# Patient Record
Sex: Female | Born: 1970 | Hispanic: Yes | Marital: Single | Smoking: Never smoker
Health system: Southern US, Community
[De-identification: ages and names within clinical notes are randomized; demographics above are authoritative.]

## PROBLEM LIST (undated history)

## (undated) DIAGNOSIS — F329 Major depressive disorder, single episode, unspecified: Secondary | ICD-10-CM

## (undated) DIAGNOSIS — K219 Gastro-esophageal reflux disease without esophagitis: Secondary | ICD-10-CM

## (undated) DIAGNOSIS — F32A Depression, unspecified: Secondary | ICD-10-CM

---
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
## 2010-04-02 ENCOUNTER — Ambulatory Visit: Payer: Self-pay

## 2011-01-15 ENCOUNTER — Ambulatory Visit: Payer: Self-pay | Admitting: Internal Medicine

## 2013-06-05 ENCOUNTER — Emergency Department: Payer: Self-pay | Admitting: Emergency Medicine

## 2013-06-05 LAB — COMPREHENSIVE METABOLIC PANEL
ALT: 16 U/L (ref 12–78)
AST: 17 U/L (ref 15–37)
Albumin: 3.7 g/dL (ref 3.4–5.0)
Alkaline Phosphatase: 88 U/L
Anion Gap: 4 — ABNORMAL LOW (ref 7–16)
BUN: 9 mg/dL (ref 7–18)
Bilirubin,Total: 0.4 mg/dL (ref 0.2–1.0)
CALCIUM: 9.1 mg/dL (ref 8.5–10.1)
CO2: 28 mmol/L (ref 21–32)
Chloride: 105 mmol/L (ref 98–107)
Creatinine: 0.67 mg/dL (ref 0.60–1.30)
EGFR (African American): >60
EGFR (Non-African Amer.): >60
GLUCOSE: 99 mg/dL (ref 65–99)
Osmolality: 273 (ref 275–301)
Potassium: 3.5 mmol/L (ref 3.5–5.1)
SODIUM: 137 mmol/L (ref 136–145)
Total Protein: 7.5 g/dL (ref 6.4–8.2)

## 2013-06-05 LAB — URINALYSIS, COMPLETE
Bacteria: NONE SEEN
Bilirubin,UR: NEGATIVE
Blood: NEGATIVE
Glucose,UR: NEGATIVE mg/dL (ref 0–75)
Leukocyte Esterase: NEGATIVE
Nitrite: NEGATIVE
Ph: 6 (ref 4.5–8.0)
Protein: 25
RBC,UR: <1 /HPF (ref 0–5)
Specific Gravity: 1.01 (ref 1.003–1.030)

## 2013-06-05 LAB — CBC
HCT: 39.1 % (ref 35.0–47.0)
HGB: 13.1 g/dL (ref 12.0–16.0)
MCH: 29.2 pg (ref 26.0–34.0)
MCHC: 33.5 g/dL (ref 32.0–36.0)
MCV: 87 fL (ref 80–100)
Platelet: 216 10*3/uL (ref 150–440)
RBC: 4.49 10*6/uL (ref 3.80–5.20)
RDW: 13.5 % (ref 11.5–14.5)
WBC: 8.1 10*3/uL (ref 3.6–11.0)

## 2013-06-05 LAB — MAGNESIUM: Magnesium: 2 mg/dL

## 2013-06-05 LAB — PHOSPHORUS: Phosphorus: 3.1 mg/dL (ref 2.5–4.9)

## 2016-03-18 ENCOUNTER — Other Ambulatory Visit: Payer: Self-pay | Admitting: Family Medicine

## 2016-03-18 DIAGNOSIS — Z1231 Encounter for screening mammogram for malignant neoplasm of breast: Secondary | ICD-10-CM

## 2016-04-21 ENCOUNTER — Ambulatory Visit
Admission: RE | Admit: 2016-04-21 | Discharge: 2016-04-21 | Disposition: A | Discharge status: Home / Self Care | Payer: 59 | Source: Ambulatory Visit | Attending: Family Medicine | Admitting: Family Medicine

## 2016-04-21 ENCOUNTER — Encounter (HOSPITAL_COMMUNITY): Payer: Self-pay

## 2016-04-21 DIAGNOSIS — Z1231 Encounter for screening mammogram for malignant neoplasm of breast: Secondary | ICD-10-CM | POA: Diagnosis not present

## 2016-04-21 DIAGNOSIS — R928 Other abnormal and inconclusive findings on diagnostic imaging of breast: Secondary | ICD-10-CM | POA: Diagnosis not present

## 2016-04-28 ENCOUNTER — Other Ambulatory Visit: Payer: Self-pay | Admitting: Family Medicine

## 2016-04-28 DIAGNOSIS — N6489 Other specified disorders of breast: Secondary | ICD-10-CM

## 2016-05-13 ENCOUNTER — Ambulatory Visit
Admission: RE | Admit: 2016-05-13 | Discharge: 2016-05-13 | Disposition: A | Discharge status: Home / Self Care | Payer: PRIVATE HEALTH INSURANCE | Source: Ambulatory Visit | Attending: Family Medicine | Admitting: Family Medicine

## 2016-05-13 DIAGNOSIS — R921 Mammographic calcification found on diagnostic imaging of breast: Secondary | ICD-10-CM | POA: Diagnosis not present

## 2016-05-13 DIAGNOSIS — N6489 Other specified disorders of breast: Secondary | ICD-10-CM

## 2016-11-20 ENCOUNTER — Other Ambulatory Visit: Payer: Self-pay | Admitting: Family Medicine

## 2016-11-20 DIAGNOSIS — R928 Other abnormal and inconclusive findings on diagnostic imaging of breast: Secondary | ICD-10-CM

## 2016-12-25 ENCOUNTER — Ambulatory Visit
Admission: RE | Admit: 2016-12-25 | Discharge: 2016-12-25 | Disposition: A | Discharge status: Home / Self Care | Payer: 59 | Source: Ambulatory Visit | Attending: Family Medicine | Admitting: Family Medicine

## 2016-12-25 DIAGNOSIS — R928 Other abnormal and inconclusive findings on diagnostic imaging of breast: Secondary | ICD-10-CM

## 2016-12-25 DIAGNOSIS — R921 Mammographic calcification found on diagnostic imaging of breast: Secondary | ICD-10-CM | POA: Diagnosis not present

## 2018-08-20 ENCOUNTER — Other Ambulatory Visit: Payer: Self-pay | Admitting: Family Medicine

## 2018-08-20 DIAGNOSIS — Z1231 Encounter for screening mammogram for malignant neoplasm of breast: Secondary | ICD-10-CM

## 2018-09-25 ENCOUNTER — Other Ambulatory Visit: Payer: Self-pay

## 2018-09-25 ENCOUNTER — Emergency Department
Admission: EM | Admit: 2018-09-25 | Discharge: 2018-09-25 | Disposition: A | Discharge status: Home / Self Care | Payer: 59 | Attending: Emergency Medicine | Admitting: Emergency Medicine

## 2018-09-25 ENCOUNTER — Emergency Department: Payer: 59

## 2018-09-25 DIAGNOSIS — R55 Syncope and collapse: Secondary | ICD-10-CM

## 2018-09-25 DIAGNOSIS — I951 Orthostatic hypotension: Secondary | ICD-10-CM | POA: Insufficient documentation

## 2018-09-25 DIAGNOSIS — R42 Dizziness and giddiness: Secondary | ICD-10-CM | POA: Diagnosis present

## 2018-09-25 LAB — URINALYSIS, COMPLETE (UACMP) WITH MICROSCOPIC
Bilirubin Urine: NEGATIVE
Glucose, UA: NEGATIVE mg/dL
Hgb urine dipstick: NEGATIVE
Ketones, ur: NEGATIVE mg/dL
Nitrite: NEGATIVE
Protein, ur: NEGATIVE mg/dL
Specific Gravity, Urine: 1.013 (ref 1.005–1.030)
pH: 6 (ref 5.0–8.0)

## 2018-09-25 LAB — BASIC METABOLIC PANEL
Anion gap: 10 (ref 5–15)
BUN: 15 mg/dL (ref 6–20)
CO2: 30 mmol/L (ref 22–32)
Calcium: 8.8 mg/dL — ABNORMAL LOW (ref 8.9–10.3)
Chloride: 99 mmol/L (ref 98–111)
Creatinine, Ser: 0.77 mg/dL (ref 0.44–1.00)
GFR calc Af Amer: >60 mL/min (ref >60)
GFR calc non Af Amer: >60 mL/min (ref >60)
Glucose, Bld: 147 mg/dL — ABNORMAL HIGH (ref 70–99)
Potassium: 4 mmol/L (ref 3.5–5.1)
Sodium: 139 mmol/L (ref 135–145)

## 2018-09-25 LAB — CBC
HCT: 38.9 % (ref 36.0–46.0)
Hemoglobin: 13.1 g/dL (ref 12.0–15.0)
MCH: 29.8 pg (ref 26.0–34.0)
MCHC: 33.7 g/dL (ref 30.0–36.0)
MCV: 88.4 fL (ref 80.0–100.0)
Platelets: 227 10*3/uL (ref 150–400)
RBC: 4.4 MIL/uL (ref 3.87–5.11)
RDW: 12.4 % (ref 11.5–15.5)
WBC: 11 10*3/uL — ABNORMAL HIGH (ref 4.0–10.5)
nRBC: 0 % (ref 0.0–0.2)

## 2018-09-25 LAB — TROPONIN I: Troponin I: <0.03 ng/mL (ref <0.03)

## 2018-09-25 LAB — POCT PREGNANCY, URINE: Preg Test, Ur: NEGATIVE

## 2018-09-25 MED ORDER — SODIUM CHLORIDE 0.9 % IV BOLUS
1000.0000 mL | Freq: Once | INTRAVENOUS | Status: AC
  Administered 2018-09-25: 1000 mL via INTRAVENOUS

## 2018-09-25 MED ORDER — ONDANSETRON HCL 4 MG/2ML IJ SOLN
4.0000 mg | Freq: Once | INTRAMUSCULAR | Status: AC
  Administered 2018-09-25: 4 mg via INTRAVENOUS
  Filled 2018-09-25: qty 2

## 2018-09-25 MED ORDER — SODIUM CHLORIDE 0.9% FLUSH
3.0000 mL | Freq: Once | INTRAVENOUS | Status: AC
  Administered 2018-09-25: 3 mL via INTRAVENOUS

## 2018-09-25 MED ORDER — ACETAMINOPHEN 500 MG PO TABS
1000.0000 mg | ORAL_TABLET | ORAL | Status: AC
  Administered 2018-09-25: 1000 mg via ORAL
  Filled 2018-09-25: qty 2

## 2018-09-25 NOTE — ED Triage Notes (Signed)
Right side ear pain with frontal headache x 1 month . currently being treated with ABX for sinus infection." Reports was dizzy today went outside for air and felt like she passed out. Reports sitting when she passed out". C/o of headache at present time 6/10

## 2018-09-25 NOTE — ED Provider Notes (Signed)
[facility] Emergency Department Provider Note   ____________________________________________   First MD Initiated Contact with Patient 09/25/18 1320     (approximate)  I have reviewed the triage vital signs and the nursing notes.   HISTORY  Chief Complaint Dizziness and Near Syncope    HPI Kara Edwards is a 48 y.o. female who reports no major medical history.  Patient does report that roughly a week or so ago she started to develop right-sided sinus pain and congestion.  Some runny nose as well.  She was placed on prescription for azithromycin and steroids which she completed through her primary care doctor.  Since that time she has been feeling a little bit congested, and trying to stay hydrated but feels that she has not quite kept up well  She denies chest pain or trouble breathing.  Today she was standing in the kitchen when she started to feel lightheaded, she describes a feeling where she felt hot she went outside to get some fresh air and then became even more fatigued and while sitting in a chair she believes that she briefly may have passed out.  She did not fall out of the chair, but reports she felt like a blanket came over her eyes and then she awoke with her parents awakening her on the chair.  He thinks she may have struck her head on the desk portion of the chair she is bit sore over the right side of the scalp after.  She did not have any palpitations or chest pain.  She is never passed out before.  She felt lightheaded, but at the present time she feels much better.  Some nausea proceeded but no vomiting.  No abdominal pain.  Denies pregnancy and is been into menopause for some time now she reports.  No ongoing fevers or chills.  Feels stuffy in the right ear and her doctor told her that that looked congested at her last visit as well   History reviewed. No pertinent past medical history.  There are no active problems to display for this  patient.   History reviewed. No pertinent surgical history.  Prior to Admission medications   Not on File    Allergies Penicillins  Family History  Problem Relation Age of Onset   Breast cancer Neg Hx     Social History Social History   Tobacco Use   Smoking status: Never Smoker   Smokeless tobacco: Never Used  Substance Use Topics   Alcohol use: Not on file   Drug use: Not on file    Review of Systems Constitutional: No fever/chills recent sinus infections improving Eyes: No visual changes except felt like a blanket over her eyes that is much better now. ENT: No sore throat.  Sinus infection improving Cardiovascular: Denies chest pain.  Believe she passed out briefly. Respiratory: Denies shortness of breath. Gastrointestinal: No abdominal pain.  Felt nauseated during the episode but this is improved. Genitourinary: Negative for dysuria. Musculoskeletal: Negative for back pain. Skin: Negative for rash. Neurological: Negative for headaches, areas of focal weakness or numbness.    ____________________________________________   PHYSICAL EXAM:  VITAL SIGNS: ED Triage Vitals  Enc Vitals Group     BP 09/25/18 1230 119/76     Pulse Rate 09/25/18 1226 (!) 56     Resp 09/25/18 1226 17     Temp 09/25/18 1226 98.6 F (37 C)     Temp Source 09/25/18 1226 Oral     SpO2  09/25/18 1226 98 %     Weight 09/25/18 1231 155 lb (70.3 kg)     Height 09/25/18 1231 5\' 2"  (1.575 m)     Head Circumference --      Peak Flow --      Pain Score 09/25/18 1231 6     Pain Loc --      Pain Edu? --      Excl. in [place]? --     Constitutional: Alert and oriented. Well appearing and in no acute distress.  She is very pleasant. Eyes: Conjunctivae are normal. Head: Atraumatic. Nose: No congestion/rhinnorhea. Mouth/Throat: Mucous membranes are moderately dry. Neck: No stridor.  Cardiovascular: Normal rate, regular rhythm. Grossly normal heart sounds.  Good peripheral  circulation. Respiratory: Normal respiratory effort.  No retractions. Lungs CTAB. Gastrointestinal: Soft and nontender. No distention. Musculoskeletal: No lower extremity tenderness nor edema. Neurologic:  Normal speech and language. No gross focal neurologic deficits are appreciated.  Moves all extremities with normal strength.  Equal smile.  Normal and clear speech. Skin:  Skin is warm, dry and intact. No rash noted. Psychiatric: Mood and affect are normal. Speech and behavior are normal.  ____________________________________________   LABS (all labs ordered are listed, but only abnormal results are displayed)  Labs Reviewed  BASIC METABOLIC PANEL - Abnormal; Notable for the following components:      Result Value   Glucose, Bld 147 (*)    Calcium 8.8 (*)    All other components within normal limits  CBC - Abnormal; Notable for the following components:   WBC 11.0 (*)    All other components within normal limits  URINALYSIS, COMPLETE (UACMP) WITH MICROSCOPIC - Abnormal; Notable for the following components:   Color, Urine YELLOW (*)    APPearance HAZY (*)    Leukocytes,Ua TRACE (*)    Bacteria, UA FEW (*)    All other components within normal limits  TROPONIN I  POCT PREGNANCY, URINE   EKG reviewed entered by me at 1242 Heart rate 50 QRS 110 QTc 460, slight artifact, normal sinus rhythm with first-degree AV block.  No evidence acute ischemia  RADIOLOGY  Ct Head Wo Contrast  Result Date: 09/25/2018 CLINICAL DATA:  RIGHT-sided ear pain with frontal headache for 1 month. EXAM: CT HEAD WITHOUT CONTRAST TECHNIQUE: Contiguous axial images were obtained from the base of the skull through the vertex without intravenous contrast. COMPARISON:  None. FINDINGS: Brain: No evidence of acute infarction, hemorrhage, hydrocephalus, extra-axial collection or mass lesion/mass effect. Normal cerebral volume. No white matter disease. Vascular: No hyperdense vessel or unexpected calcification.  Skull: Normal. Negative for fracture or focal lesion. Sinuses/Orbits: No acute finding. Visualized paranasal sinuses are clear. Other: No middle ear or mastoid fluid is evident. Scalp soft tissues unremarkable. IMPRESSION: Negative exam. Electronically Signed   By: Staci Righter M.D.   On: 09/25/2018 14:39    CT head obtained to further evaluate for possible sinusitis, also evaluate for evidence of trauma as she reports she is mildly sore over the right side of the face she may have struck her head on the chair fall out of bed ____________________________________________   PROCEDURES  Procedure(s) performed: None  Procedures  Critical Care performed: No  ____________________________________________   INITIAL IMPRESSION / ASSESSMENT AND PLAN / ED COURSE  Pertinent labs & imaging results that were available during my care of the patient were reviewed by me and considered in my medical decision making (see chart for details).   Patient presents for syncopal  episode.  In the setting of standing, starting to feel nauseated lightheaded I suspect this was related to some type of orthostatic syncope, possibly driven by some dehydration in the setting of a recent sinusitis which she reports is improving.  Very clinically reassuring examination without neurologic deficit.  No acute cardiopulmonary symptoms.  Reassuring abdominal exam at the present time she is quite asymptomatic.  Plan to check orthostatics, provide IV hydration, check basic labs EKG and exclude concerning causes for syncope though in this case I do not see an immediate concern based on clinical history and exam for a life-threatening etiology, rather suspect likely some dehydration  Clinical Course as of Sep 24 2125  Sat Sep 25, 2018  1544 Patient is ambulatory, feeling improved.  Resting comfortably no distress.  Work-up thus far unrevealing, I suspect likely some type of vasovagal syncopal episode occurred.  Reassuring CT and lab  work, vital signs stable and she has been able to ambulate without distress and feels improved after hydration.  Patient has about 500 mL's of normal saline left to infuse, signed out to Dr. Corky Downs to follow-up on patient after completion of IV fluids and likely discharge to home if she remains well.  The patient is comfortable with this plan and feels well, comfortable with careful return precautions and follow-up.  He is fully alert and appears appropriate for discharge after completing fluids   [MQ]    Clinical Course User Index [MQ] Delman Kitten, MD   Patient feels much better her fluids.  Ongoing care assigned to Dr. Corky Downs, reassess after completion of fluid bolus.  Spaete likely discharged home which the patient is comfortable with.  ____________________________________________   FINAL CLINICAL IMPRESSION(S) / ED DIAGNOSES  Final diagnoses:  Syncope and collapse  Syncope due to orthostatic hypotension        Note:  This document was prepared using Dragon voice recognition software and may include unintentional dictation errors       Delman Kitten, MD 10/02/18 0006

## 2018-09-25 NOTE — ED Notes (Signed)
Pt verbalized understanding of discharge instructions. NAD at this time. 

## 2018-09-25 NOTE — ED Notes (Signed)
Orthostatics completed, ns bolus infusing. Po meds given. Patient reports positive relief of nausea with meds given.

## 2018-12-13 ENCOUNTER — Ambulatory Visit
Admission: RE | Admit: 2018-12-13 | Discharge: 2018-12-13 | Disposition: A | Discharge status: Home / Self Care | Payer: 59 | Source: Ambulatory Visit | Attending: Family Medicine | Admitting: Family Medicine

## 2018-12-13 ENCOUNTER — Other Ambulatory Visit: Payer: Self-pay

## 2018-12-13 DIAGNOSIS — Z1231 Encounter for screening mammogram for malignant neoplasm of breast: Secondary | ICD-10-CM

## 2019-01-04 ENCOUNTER — Other Ambulatory Visit: Payer: Self-pay

## 2019-01-04 ENCOUNTER — Other Ambulatory Visit
Admission: RE | Admit: 2019-01-04 | Discharge: 2019-01-04 | Disposition: A | Discharge status: Home / Self Care | Payer: 59 | Source: Ambulatory Visit | Attending: Gastroenterology | Admitting: Gastroenterology

## 2019-01-04 DIAGNOSIS — Z20828 Contact with and (suspected) exposure to other viral communicable diseases: Secondary | ICD-10-CM | POA: Diagnosis not present

## 2019-01-04 DIAGNOSIS — Z01812 Encounter for preprocedural laboratory examination: Secondary | ICD-10-CM | POA: Diagnosis not present

## 2019-01-04 LAB — SARS CORONAVIRUS 2 (TAT 6-24 HRS): SARS Coronavirus 2: NEGATIVE

## 2019-01-06 ENCOUNTER — Encounter: Payer: Self-pay | Admitting: *Deleted

## 2019-01-07 ENCOUNTER — Ambulatory Visit: Payer: 59 | Admitting: Anesthesiology

## 2019-01-07 ENCOUNTER — Other Ambulatory Visit: Payer: Self-pay

## 2019-01-07 ENCOUNTER — Encounter
Admission: RE | Disposition: A | Discharge status: Home / Self Care | Payer: Self-pay | Source: Ambulatory Visit | Attending: Gastroenterology

## 2019-01-07 ENCOUNTER — Ambulatory Visit
Admission: RE | Admit: 2019-01-07 | Discharge: 2019-01-07 | Disposition: A | Discharge status: Home / Self Care | Payer: 59 | Source: Ambulatory Visit | Attending: Gastroenterology | Admitting: Gastroenterology

## 2019-01-07 DIAGNOSIS — Z8371 Family history of colonic polyps: Secondary | ICD-10-CM | POA: Diagnosis not present

## 2019-01-07 DIAGNOSIS — F329 Major depressive disorder, single episode, unspecified: Secondary | ICD-10-CM | POA: Diagnosis not present

## 2019-01-07 DIAGNOSIS — K219 Gastro-esophageal reflux disease without esophagitis: Secondary | ICD-10-CM | POA: Insufficient documentation

## 2019-01-07 DIAGNOSIS — K228 Other specified diseases of esophagus: Secondary | ICD-10-CM | POA: Diagnosis not present

## 2019-01-07 DIAGNOSIS — K2950 Unspecified chronic gastritis without bleeding: Secondary | ICD-10-CM | POA: Diagnosis not present

## 2019-01-07 DIAGNOSIS — K529 Noninfective gastroenteritis and colitis, unspecified: Secondary | ICD-10-CM | POA: Insufficient documentation

## 2019-01-07 DIAGNOSIS — K317 Polyp of stomach and duodenum: Secondary | ICD-10-CM | POA: Insufficient documentation

## 2019-01-07 DIAGNOSIS — Z888 Allergy status to other drugs, medicaments and biological substances status: Secondary | ICD-10-CM | POA: Diagnosis not present

## 2019-01-07 DIAGNOSIS — D120 Benign neoplasm of cecum: Secondary | ICD-10-CM | POA: Diagnosis not present

## 2019-01-07 DIAGNOSIS — K5900 Constipation, unspecified: Secondary | ICD-10-CM | POA: Insufficient documentation

## 2019-01-07 DIAGNOSIS — K5730 Diverticulosis of large intestine without perforation or abscess without bleeding: Secondary | ICD-10-CM | POA: Diagnosis not present

## 2019-01-07 DIAGNOSIS — Z880 Allergy status to penicillin: Secondary | ICD-10-CM | POA: Insufficient documentation

## 2019-01-07 DIAGNOSIS — Z79899 Other long term (current) drug therapy: Secondary | ICD-10-CM | POA: Diagnosis not present

## 2019-01-07 HISTORY — DX: Depression, unspecified: F32.A

## 2019-01-07 HISTORY — DX: Gastro-esophageal reflux disease without esophagitis: K21.9

## 2019-01-07 HISTORY — PX: ESOPHAGOGASTRODUODENOSCOPY (EGD) WITH PROPOFOL: SHX5813

## 2019-01-07 HISTORY — PX: COLONOSCOPY WITH PROPOFOL: SHX5780

## 2019-01-07 LAB — POCT PREGNANCY, URINE: Preg Test, Ur: NEGATIVE

## 2019-01-07 SURGERY — ESOPHAGOGASTRODUODENOSCOPY (EGD) WITH PROPOFOL
Anesthesia: General

## 2019-01-07 MED ORDER — FENTANYL CITRATE (PF) 100 MCG/2ML IJ SOLN
INTRAMUSCULAR | Status: AC
  Filled 2019-01-07: qty 2

## 2019-01-07 MED ORDER — GLYCOPYRROLATE 0.2 MG/ML IJ SOLN
INTRAMUSCULAR | Status: DC | PRN
  Administered 2019-01-07: 0.2 mg via INTRAVENOUS

## 2019-01-07 MED ORDER — MIDAZOLAM HCL 2 MG/2ML IJ SOLN
INTRAMUSCULAR | Status: AC
  Filled 2019-01-07: qty 2

## 2019-01-07 MED ORDER — MIDAZOLAM HCL 5 MG/5ML IJ SOLN
INTRAMUSCULAR | Status: DC | PRN
  Administered 2019-01-07: 2 mg via INTRAVENOUS

## 2019-01-07 MED ORDER — PROPOFOL 10 MG/ML IV BOLUS
INTRAVENOUS | Status: DC | PRN
  Administered 2019-01-07: 30 mg via INTRAVENOUS
  Administered 2019-01-07: 20 mg via INTRAVENOUS

## 2019-01-07 MED ORDER — PROPOFOL 500 MG/50ML IV EMUL
INTRAVENOUS | Status: DC | PRN
  Administered 2019-01-07: 50 ug/kg/min via INTRAVENOUS

## 2019-01-07 MED ORDER — SODIUM CHLORIDE 0.9 % IV SOLN
INTRAVENOUS | Status: DC
  Administered 2019-01-07 (×2): via INTRAVENOUS

## 2019-01-07 MED ORDER — LIDOCAINE HCL (PF) 2 % IJ SOLN
INTRAMUSCULAR | Status: DC | PRN
  Administered 2019-01-07: 60 mg

## 2019-01-07 MED ORDER — FENTANYL CITRATE (PF) 100 MCG/2ML IJ SOLN
INTRAMUSCULAR | Status: DC | PRN
  Administered 2019-01-07 (×2): 50 ug via INTRAVENOUS

## 2019-01-07 MED ORDER — LIDOCAINE HCL (PF) 2 % IJ SOLN
INTRAMUSCULAR | Status: AC
  Filled 2019-01-07: qty 10

## 2019-01-07 MED ORDER — GLYCOPYRROLATE 0.2 MG/ML IJ SOLN
INTRAMUSCULAR | Status: AC
  Filled 2019-01-07: qty 1

## 2019-01-07 MED ORDER — SODIUM CHLORIDE 0.9 % IV SOLN
INTRAVENOUS | Status: DC
  Administered 2019-01-07: 08:00:00 via INTRAVENOUS

## 2019-01-07 NOTE — Transfer of Care (Signed)
Immediate Anesthesia Transfer of Care Note  Patient: Kara Edwards  Procedure(s) Performed: ESOPHAGOGASTRODUODENOSCOPY (EGD) WITH PROPOFOL (N/A ) COLONOSCOPY WITH PROPOFOL (N/A )  Patient Location: PACU  Anesthesia Type:General  Level of Consciousness: sedated  Airway & Oxygen Therapy: Patient Spontanous Breathing and Patient connected to nasal cannula oxygen  Post-op Assessment: Report given to RN and Post -op Vital signs reviewed and stable  Post vital signs: Reviewed and stable  Last Vitals:  Vitals Value Taken Time  BP    Temp    Pulse 71 01/07/19 0956  Resp 12 01/07/19 0956  SpO2 99 % 01/07/19 0956  Vitals shown include unvalidated device data.  Last Pain:  Vitals:   01/07/19 0950  TempSrc: Oral  PainSc:          Complications: No apparent anesthesia complications

## 2019-01-07 NOTE — Anesthesia Post-op Follow-up Note (Signed)
Anesthesia QCDR form completed.        

## 2019-01-07 NOTE — Op Note (Signed)
[facility] Gastroenterology Patient Name: Kara Edwards Procedure Date: 01/07/2019 8:34 AM MRN: [id number] Account #: [id number] Date of Birth: Aug 15, 1970 Admit Type: Outpatient Age: 48 Room: [facility] ENDO ROOM 3 Gender: Female Note Status: Finalized Procedure:            Upper GI endoscopy Indications:          Epigastric abdominal pain, Abdominal pain in the right                        upper quadrant, Dyspepsia Providers:            Lollie Sails, MD Medicines:            Monitored Anesthesia Care Complications:        No immediate complications. Procedure:            Pre-Anesthesia Assessment:                       - ASA Grade Assessment: II - A patient with mild                        systemic disease.                       After obtaining informed consent, the endoscope was                        passed under direct vision. Throughout the procedure,                        the patient's blood pressure, pulse, and oxygen                        saturations were monitored continuously. The Endoscope                        was introduced through the mouth, and advanced to the                        third part of duodenum. The upper GI endoscopy was                        accomplished without difficulty. The patient tolerated                        the procedure well. Findings:      The Z-line was variable. Biopsies were taken with a cold forceps for       histology.      The exam of the esophagus was otherwise normal.      A few 2 to 5 mm pedunculated and sessile polyps with no bleeding and no       stigmata of recent bleeding were found in the gastric body. Biopsies       were taken with a cold forceps for histology.      The exam of the stomach was otherwise normal.      Biopsies were taken with a cold forceps in the gastric body and in the       gastric antrum for histology.      The cardia and gastric fundus were normal on retroflexion.      Diffuse mild  mucosal variance characterized by flattening was found in       the entire duodenum. Impression:           - Z-line variable. Biopsied.                       - A few gastric polyps. Biopsied.                       - Mucosal variant in the duodenum.                       - Biopsies were taken with a cold forceps for histology                        in the gastric body and in the gastric antrum. Recommendation:       - Discharge patient to home.                       - Await pathology results.                       - Continue present medications.                       - Return to GI clinic in 3 weeks.                       - consider abdominal ultrasound, HIDA/CCK fo continued                        symptoms.                       - Perform a colonoscopy today. Procedure Code(s):    --- Professional ---                       [phone], Esophagogastroduodenoscopy, flexible, transoral;                        with biopsy, single or multiple Diagnosis Code(s):    --- Professional ---                       K22.8, Other specified diseases of esophagus                       K31.7, Polyp of stomach and duodenum                       K31.89, Other diseases of stomach and duodenum                       R10.13, Epigastric pain                       R10.11, Right upper quadrant pain CPT copyright 2019 American Medical Association. All rights reserved. The codes documented in this report are preliminary and upon coder review may  be revised to meet current compliance requirements. Lollie Sails, MD 01/07/2019 9:37:38 AM This report has been signed electronically. Number of Addenda: 0 Note Initiated On: 01/07/2019 8:34 AM      [facility]

## 2019-01-07 NOTE — Op Note (Signed)
[facility] Gastroenterology Patient Name: Kara Edwards Procedure Date: 01/07/2019 8:34 AM MRN: [id number] Account #: [id number] Date of Birth: Nov 18, 1970 Admit Type: Outpatient Age: 48 Room: [facility] ENDO ROOM 3 Gender: Female Note Status: Finalized Procedure:            Colonoscopy Indications:          Family history of colonic polyps in a first-degree                        relative Providers:            Lollie Sails, MD Medicines:            Monitored Anesthesia Care Complications:        No immediate complications. Procedure:            Pre-Anesthesia Assessment:                       - ASA Grade Assessment: II - A patient with mild                        systemic disease.                       After obtaining informed consent, the colonoscope was                        passed under direct vision. Throughout the procedure,                        the patient's blood pressure, pulse, and oxygen                        saturations were monitored continuously. The                        Colonoscope was introduced through the anus and                        advanced to the the cecum, identified by appendiceal                        orifice and ileocecal valve. The colonoscopy was                        performed without difficulty. The patient tolerated the                        procedure well. The quality of the bowel preparation                        was good. Findings:      A 3 mm polyp was found in the transverse colon. The polyp was sessile.       The polyp was removed with a cold biopsy forceps. Resection and       retrieval were complete.      A 2 mm polyp was found in the cecum. The polyp was sessile. The polyp       was removed with a cold biopsy forceps. Resection and retrieval were       complete.      A few small-mouthed diverticula were  found in the sigmoid colon.      The retroflexed view of the distal rectum and anal verge was normal and   showed no anal or rectal abnormalities.      The digital rectal exam was normal. Impression:           - One 3 mm polyp in the transverse colon, removed with                        a cold biopsy forceps. Resected and retrieved.                       - One 2 mm polyp in the cecum, removed with a cold                        biopsy forceps. Resected and retrieved.                       - Diverticulosis in the sigmoid colon.                       - The distal rectum and anal verge are normal on                        retroflexion view. Recommendation:       - Discharge patient to home.                       - Telephone GI clinic for pathology results in 1 week. Procedure Code(s):    --- Professional ---                       [phone], Colonoscopy, flexible; with biopsy, single or                        multiple Diagnosis Code(s):    --- Professional ---                       K63.5, Polyp of colon                       Z83.71, Family history of colonic polyps                       K57.30, Diverticulosis of large intestine without                        perforation or abscess without bleeding CPT copyright 2019 American Medical Association. All rights reserved. The codes documented in this report are preliminary and upon coder review may  be revised to meet current compliance requirements. Lollie Sails, MD 01/07/2019 9:54:14 AM This report has been signed electronically. Number of Addenda: 0 Note Initiated On: 01/07/2019 8:34 AM Scope Withdrawal Time: 0 hours 4 minutes 31 seconds  Total Procedure Duration: 0 hours 10 minutes 52 seconds       [facility]

## 2019-01-07 NOTE — Anesthesia Postprocedure Evaluation (Signed)
Anesthesia Post Note  Patient: Kara Edwards  Procedure(s) Performed: ESOPHAGOGASTRODUODENOSCOPY (EGD) WITH PROPOFOL (N/A ) COLONOSCOPY WITH PROPOFOL (N/A )  Patient location during evaluation: Endoscopy Anesthesia Type: General Level of consciousness: awake and alert and oriented Pain management: pain level controlled Vital Signs Assessment: post-procedure vital signs reviewed and stable Respiratory status: spontaneous breathing, nonlabored ventilation and respiratory function stable Cardiovascular status: blood pressure returned to baseline and stable Postop Assessment: no signs of nausea or vomiting Anesthetic complications: no     Last Vitals:  Vitals:   01/07/19 1010 01/07/19 1020  BP: 115/74 119/79  Pulse: 75 83  Resp: 15 (!) 21  Temp:    SpO2: 100% 100%    Last Pain:  Vitals:   01/07/19 0950  TempSrc: Oral  PainSc:                  [name]

## 2019-01-07 NOTE — Anesthesia Preprocedure Evaluation (Signed)
Anesthesia Evaluation  Patient identified by MRN, date of birth, ID band Patient awake    Reviewed: Allergy & Precautions, NPO status , Patient's Chart, lab work & pertinent test results  History of Anesthesia Complications Negative for: history of anesthetic complications  Airway Mallampati: II  TM Distance: >3 FB Neck ROM: Full    Dental no notable dental hx.    Pulmonary neg pulmonary ROS, neg sleep apnea, neg COPD,    breath sounds clear to auscultation- rhonchi (-) wheezing      Cardiovascular Exercise Tolerance: Good (-) hypertension(-) CAD, (-) Past MI, (-) Cardiac Stents and (-) CABG  Rhythm:Regular Rate:Normal - Systolic murmurs and - Diastolic murmurs    Neuro/Psych neg Seizures PSYCHIATRIC DISORDERS Depression negative neurological ROS     GI/Hepatic Neg liver ROS, GERD  ,  Endo/Other  negative endocrine ROSneg diabetes  Renal/GU negative Renal ROS     Musculoskeletal negative musculoskeletal ROS (+)   Abdominal (+) - obese,   Peds  Hematology negative hematology ROS (+)   Anesthesia Other Findings Past Medical History: No date: Depression No date: GERD (gastroesophageal reflux disease)   Reproductive/Obstetrics                             Anesthesia Physical Anesthesia Plan  ASA: II  Anesthesia Plan: General   Post-op Pain Management:    Induction: Intravenous  PONV Risk Score and Plan: 2 and Propofol infusion  Airway Management Planned: Natural Airway  Additional Equipment:   Intra-op Plan:   Post-operative Plan:   Informed Consent: I have reviewed the patients History and Physical, chart, labs and discussed the procedure including the risks, benefits and alternatives for the proposed anesthesia with the patient or authorized representative who has indicated his/her understanding and acceptance.     Dental advisory given  Plan Discussed with: CRNA and  Anesthesiologist  Anesthesia Plan Comments:         Anesthesia Quick Evaluation

## 2019-01-07 NOTE — H&P (Signed)
Outpatient short stay form Pre-procedure 01/07/2019 8:49 AM Kara Sails MD  Primary Physician: Dr Salome Holmes  Reason for visit: EGD and colonoscopy  History of present illness: Patient is a 48 year old female presenting today for an EGD and colonoscopy in regards to her mother's history of colon polyps as well as her personal history of epigastric and right upper quadrant discomfort/dyspepsia.  The symptoms do not awaken her at night.  She does have some problems with constipation I discussed this at length with her.  I have recommended that she consider trying some MiraLAX on a regular basis.  She tolerated her prep well.  She takes no aspirin or blood thinning agent.  sHe takes no NSAIDs.    Current Facility-Administered Medications:  .  0.9 %  sodium chloride infusion, , Intravenous, Continuous, Kara Sails, MD .  0.9 %  sodium chloride infusion, , Intravenous, Continuous, Kara Sails, MD, Last Rate: 20 mL/hr at 01/07/19 0815  Medications Prior to Admission  Medication Sig Dispense Refill Last Dose  . citalopram (CELEXA) 20 MG tablet Take 20 mg by mouth daily.   01/06/2019 at Unknown time  . fluticasone (FLONASE) 50 MCG/ACT nasal spray Place into both nostrils daily.   01/06/2019 at Unknown time  . ipratropium (ATROVENT) 0.03 % nasal spray Place 2 sprays into both nostrils 2 (two) times daily.   01/06/2019 at Unknown time  . pantoprazole (PROTONIX) 40 MG tablet Take 40 mg by mouth 2 (two) times daily.   01/06/2019 at Unknown time     Allergies  Allergen Reactions  . Penicillins Swelling and Rash  . Zyrtec [Cetirizine] Diarrhea     Past Medical History:  Diagnosis Date  . Depression   . GERD (gastroesophageal reflux disease)     Review of systems:      Physical Exam    Heart and lungs: Regular rate and rhythm without rub or gallop lungs are bilaterally clear    HEENT: Normocephalic atraumatic eyes are anicteric    Other:    Pertinant exam for procedure:  Soft mild discomfort to palpation the epigastric region bowel sounds positive normoactive.    Planned proceedures: EGD, colonoscopy and indicated procedures. I have discussed the risks benefits and complications of procedures to include not limited to bleeding, infection, perforation and the risk of sedation and the patient wishes to proceed.    Kara Sails, MD Gastroenterology 01/07/2019  8:49 AM

## 2019-01-10 ENCOUNTER — Encounter: Payer: Self-pay | Admitting: Gastroenterology

## 2019-01-11 LAB — SURGICAL PATHOLOGY

## 2019-02-14 ENCOUNTER — Other Ambulatory Visit (HOSPITAL_COMMUNITY): Payer: Self-pay | Admitting: Gastroenterology

## 2019-02-14 ENCOUNTER — Other Ambulatory Visit: Payer: Self-pay | Admitting: Gastroenterology

## 2019-02-14 DIAGNOSIS — R1011 Right upper quadrant pain: Secondary | ICD-10-CM

## 2019-02-24 ENCOUNTER — Ambulatory Visit: Payer: 59

## 2019-02-24 ENCOUNTER — Other Ambulatory Visit: Payer: 59

## 2019-03-03 ENCOUNTER — Ambulatory Visit: Payer: 59

## 2019-03-24 ENCOUNTER — Ambulatory Visit
Admission: RE | Admit: 2019-03-24 | Discharge: 2019-03-24 | Disposition: A | Discharge status: Home / Self Care | Payer: 59 | Source: Ambulatory Visit | Attending: Gastroenterology | Admitting: Gastroenterology

## 2019-03-24 ENCOUNTER — Other Ambulatory Visit: Payer: Self-pay

## 2019-03-24 ENCOUNTER — Encounter
Admission: RE | Admit: 2019-03-24 | Discharge: 2019-03-24 | Disposition: A | Discharge status: Home / Self Care | Payer: 59 | Source: Ambulatory Visit | Attending: Gastroenterology | Admitting: Gastroenterology

## 2019-03-24 DIAGNOSIS — R1011 Right upper quadrant pain: Secondary | ICD-10-CM

## 2019-03-24 MED ORDER — TECHNETIUM TC 99M MEBROFENIN IV KIT
4.9800 | PACK | Freq: Once | INTRAVENOUS | Status: AC | PRN
  Administered 2019-03-24: 4.98 via INTRAVENOUS

## 2019-07-25 ENCOUNTER — Ambulatory Visit: Payer: 59 | Attending: Internal Medicine

## 2019-07-25 DIAGNOSIS — Z23 Encounter for immunization: Secondary | ICD-10-CM

## 2019-07-25 NOTE — Progress Notes (Signed)
   Covid-19 Vaccination Clinic  Name:  [name]    MRN: [id number] DOB: [birth year]  07/25/2019  [name] was observed post Covid-19 immunization for 15 minutes without incidence. She was provided with Vaccine Information Sheet and instruction to access the V-Safe system.   [name] was instructed to call 911 with any severe reactions post vaccine: Marland Kitchen Difficulty breathing  . Swelling of your face and throat  . A fast heartbeat  . A bad rash all over your body  . Dizziness and weakness    Immunizations Administered    Name Date Dose VIS Date Route   Moderna COVID-19 Vaccine 07/25/2019 11:47 AM 0.5 mL 05/03/2019 Intramuscular   Manufacturer: Moderna   Lot: [id number]   [id number]

## 2019-08-23 ENCOUNTER — Ambulatory Visit: Payer: 59 | Attending: Internal Medicine

## 2019-08-23 DIAGNOSIS — Z23 Encounter for immunization: Secondary | ICD-10-CM

## 2019-08-23 NOTE — Progress Notes (Signed)
   Covid-19 Vaccination Clinic  Name:  Kara Edwards    MRN: [id number] DOB: 1971-05-25  08/23/2019  [name] was observed post Covid-19 immunization for 15 minutes without incident. She was provided with Vaccine Information Sheet and instruction to access the V-Safe system.   [name] was instructed to call 911 with any severe reactions post vaccine: Marland Kitchen Difficulty breathing  . Swelling of face and throat  . A fast heartbeat  . A bad rash all over body  . Dizziness and weakness   Immunizations Administered    Name Date Dose VIS Date Route   Moderna COVID-19 Vaccine 08/23/2019 10:53 AM 0.5 mL 05/03/2019 Intramuscular   Manufacturer: Levan Hurst   LotUT:[id number]   [id number]

## 2020-01-30 ENCOUNTER — Ambulatory Visit: Payer: 59

## 2020-01-31 ENCOUNTER — Ambulatory Visit: Payer: 59

## 2020-02-13 ENCOUNTER — Other Ambulatory Visit: Payer: Self-pay | Admitting: Family Medicine

## 2020-02-13 DIAGNOSIS — Z1231 Encounter for screening mammogram for malignant neoplasm of breast: Secondary | ICD-10-CM

## 2020-02-20 ENCOUNTER — Ambulatory Visit: Payer: 59

## 2020-02-23 ENCOUNTER — Other Ambulatory Visit: Payer: Self-pay

## 2020-02-23 ENCOUNTER — Ambulatory Visit
Admission: RE | Admit: 2020-02-23 | Discharge: 2020-02-23 | Disposition: A | Discharge status: Home / Self Care | Payer: BC Managed Care – PPO | Source: Ambulatory Visit | Attending: Family Medicine | Admitting: Family Medicine

## 2020-02-23 DIAGNOSIS — Z1231 Encounter for screening mammogram for malignant neoplasm of breast: Secondary | ICD-10-CM

## 2020-04-16 ENCOUNTER — Ambulatory Visit: Payer: 59 | Attending: Internal Medicine

## 2020-04-16 DIAGNOSIS — Z23 Encounter for immunization: Secondary | ICD-10-CM

## 2020-04-16 NOTE — Progress Notes (Signed)
   Covid-19 Vaccination Clinic  Name:  Kara Edwards    MRN: [id number] DOB: 02-18-1971  04/16/2020  Kara Edwards was observed post Covid-19 immunization for 15 minutes without incident. She was provided with Vaccine Information Sheet and instruction to access the V-Safe system.   Kara Edwards was instructed to call 911 with any severe reactions post vaccine: Marland Kitchen Difficulty breathing  . Swelling of face and throat  . A fast heartbeat  . A bad rash all over body  . Dizziness and weakness   Immunizations Administered    No immunizations on file.

## 2020-05-04 ENCOUNTER — Other Ambulatory Visit: Payer: 59

## 2020-05-04 DIAGNOSIS — Z20822 Contact with and (suspected) exposure to covid-19: Secondary | ICD-10-CM

## 2020-05-05 LAB — SARS-COV-2, NAA 2 DAY TAT

## 2020-05-05 LAB — NOVEL CORONAVIRUS, NAA: SARS-CoV-2, NAA: NOT DETECTED

## 2020-06-15 ENCOUNTER — Other Ambulatory Visit: Payer: 59

## 2020-06-15 ENCOUNTER — Other Ambulatory Visit: Payer: Self-pay

## 2020-06-15 DIAGNOSIS — Z20822 Contact with and (suspected) exposure to covid-19: Secondary | ICD-10-CM

## 2020-06-17 LAB — NOVEL CORONAVIRUS, NAA: SARS-CoV-2, NAA: NOT DETECTED

## 2020-06-17 LAB — SARS-COV-2, NAA 2 DAY TAT

## 2020-09-07 ENCOUNTER — Ambulatory Visit: Payer: 59 | Attending: Critical Care Medicine

## 2020-09-07 DIAGNOSIS — Z20822 Contact with and (suspected) exposure to covid-19: Secondary | ICD-10-CM

## 2020-09-08 LAB — SARS-COV-2, NAA 2 DAY TAT

## 2020-09-08 LAB — NOVEL CORONAVIRUS, NAA: SARS-CoV-2, NAA: NOT DETECTED

## 2021-03-06 ENCOUNTER — Other Ambulatory Visit: Payer: Self-pay | Admitting: Family Medicine

## 2021-03-06 DIAGNOSIS — Z1231 Encounter for screening mammogram for malignant neoplasm of breast: Secondary | ICD-10-CM

## 2021-03-21 ENCOUNTER — Ambulatory Visit: Payer: 59

## 2021-03-28 ENCOUNTER — Ambulatory Visit: Payer: 59 | Attending: Internal Medicine

## 2021-03-28 ENCOUNTER — Other Ambulatory Visit: Payer: Self-pay

## 2021-03-28 DIAGNOSIS — Z23 Encounter for immunization: Secondary | ICD-10-CM

## 2021-03-28 MED ORDER — MODERNA COVID-19 BIVAL BOOSTER 50 MCG/0.5ML IM SUSP
INTRAMUSCULAR | 0 refills | Status: AC
  Filled 2021-03-28: qty 0.5, 1d supply, fill #0

## 2021-03-28 NOTE — Progress Notes (Signed)
   Covid-19 Vaccination Clinic  Name:  Kara Edwards    MRN: [id number] DOB: [birth year]  03/28/2021  Kara Edwards was observed post Covid-19 immunization for 30 minutes based on pre-vaccination screening without incident. She was provided with Vaccine Information Sheet and instruction to access the V-Safe system.   Kara Edwards was instructed to call 911 with any severe reactions post vaccine: Difficulty breathing  Swelling of face and throat  A fast heartbeat  A bad rash all over body  Dizziness and weakness   Immunizations Administered     Name Date Dose VIS Date Route   Moderna Covid-19 vaccine Bivalent Booster 03/28/2021  9:49 AM 0.5 mL 01/12/2021 Intramuscular   Manufacturer: Moderna   Lot: [id number]   [place]: [phone]

## 2021-04-02 ENCOUNTER — Ambulatory Visit: Payer: 59

## 2021-05-13 ENCOUNTER — Ambulatory Visit
Admission: RE | Admit: 2021-05-13 | Discharge: 2021-05-13 | Disposition: A | Discharge status: Home / Self Care | Payer: BC Managed Care – PPO | Source: Ambulatory Visit | Attending: Family Medicine | Admitting: Family Medicine

## 2021-05-13 ENCOUNTER — Other Ambulatory Visit: Payer: Self-pay

## 2021-05-13 DIAGNOSIS — Z1231 Encounter for screening mammogram for malignant neoplasm of breast: Secondary | ICD-10-CM | POA: Insufficient documentation

## 2022-01-17 IMAGING — MG DIGITAL SCREENING BILAT W/ TOMO W/ CAD
8 series · 8 of 24 positions shown · non-contrast
Comparison: Previous exam(s).

CLINICAL DATA: Screening.

EXAM:
DIGITAL SCREENING BILATERAL MAMMOGRAM WITH TOMO AND CAD

[L MLO synth-2D]
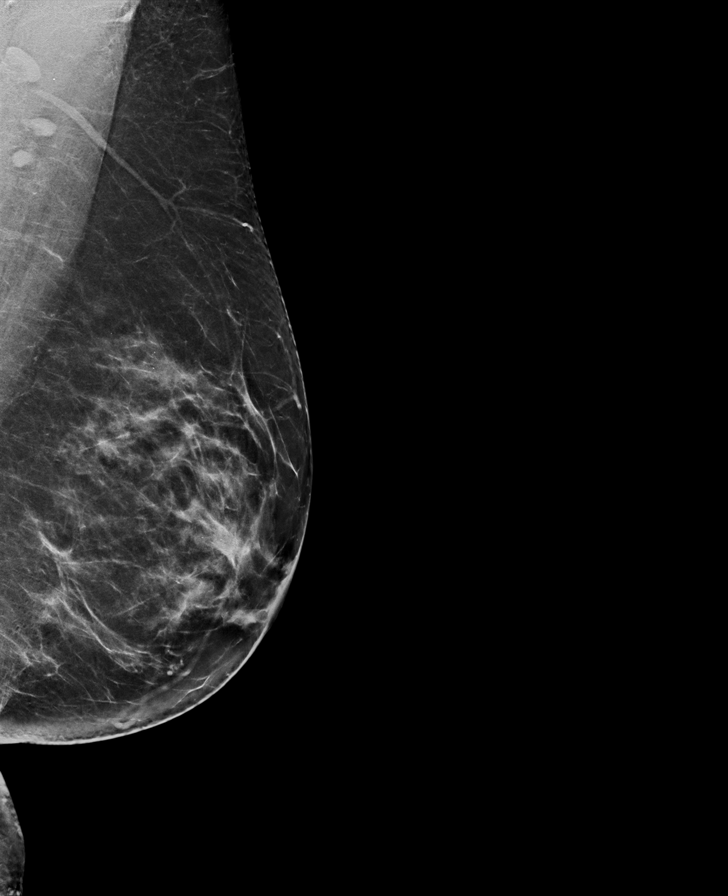

[R CC synth-2D]
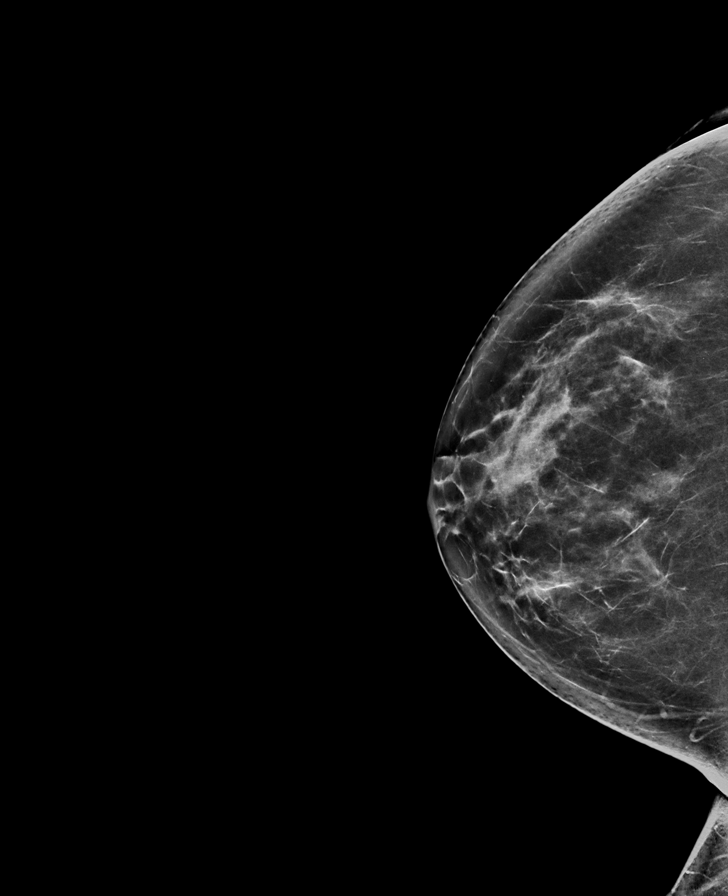

[R MLO synth-2D]
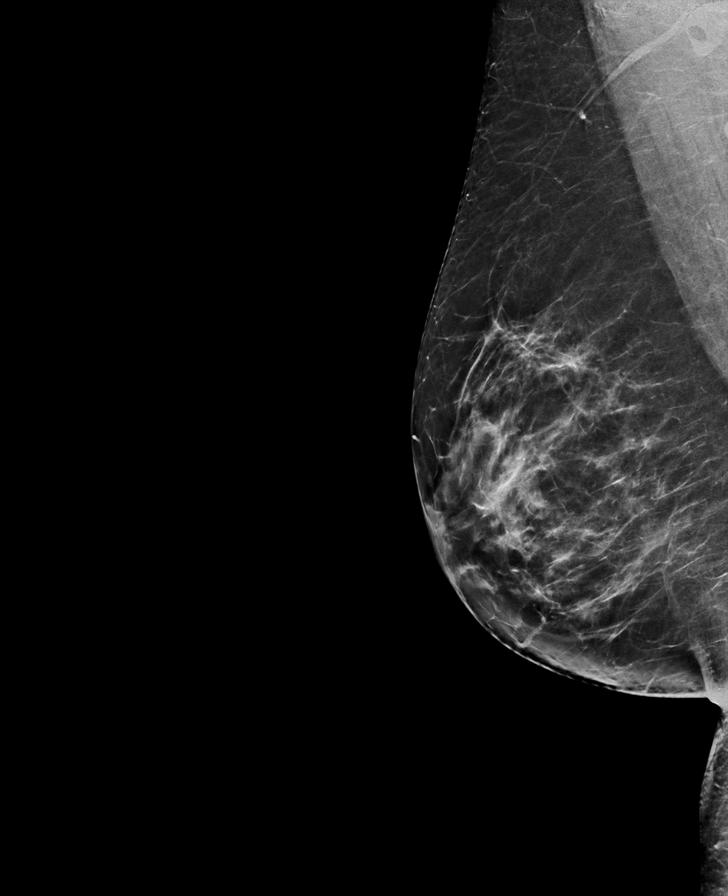

[L CC synth-2D]
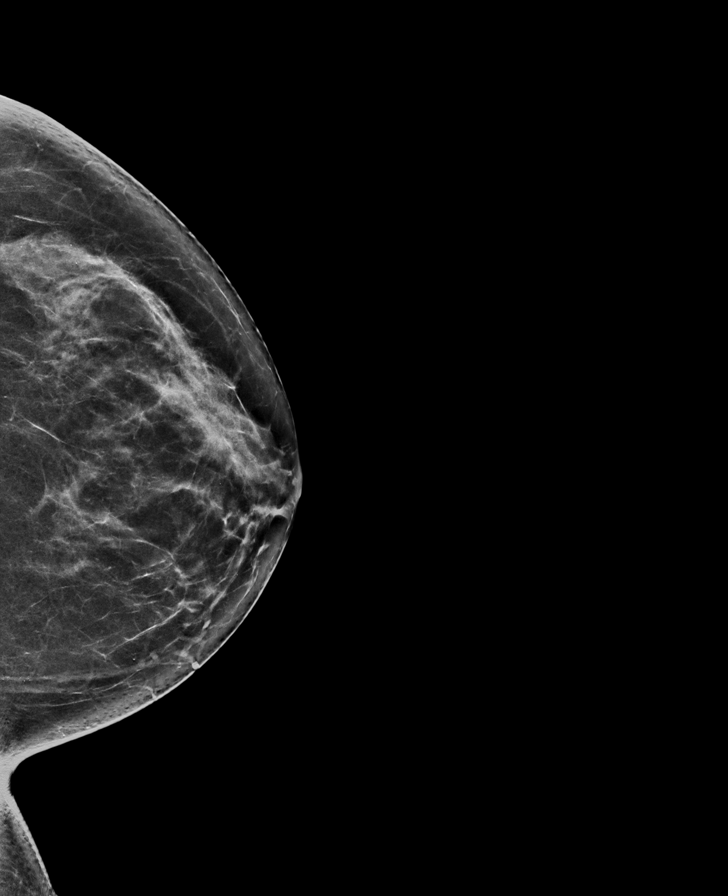

[L MLO tomo · tomo slice 39/77.0]
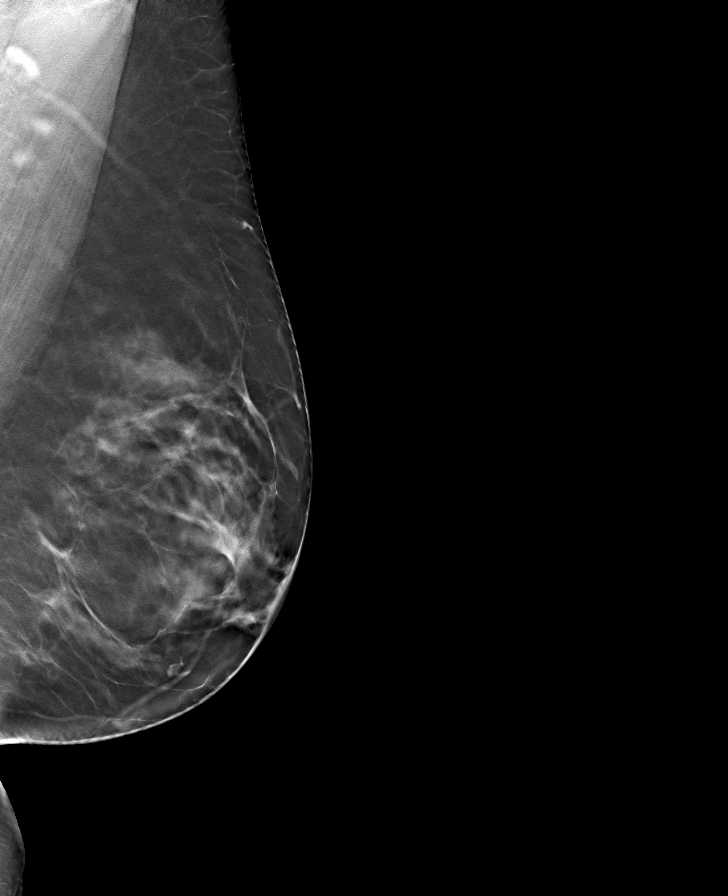

[R CC tomo · tomo slice 39/78.0]
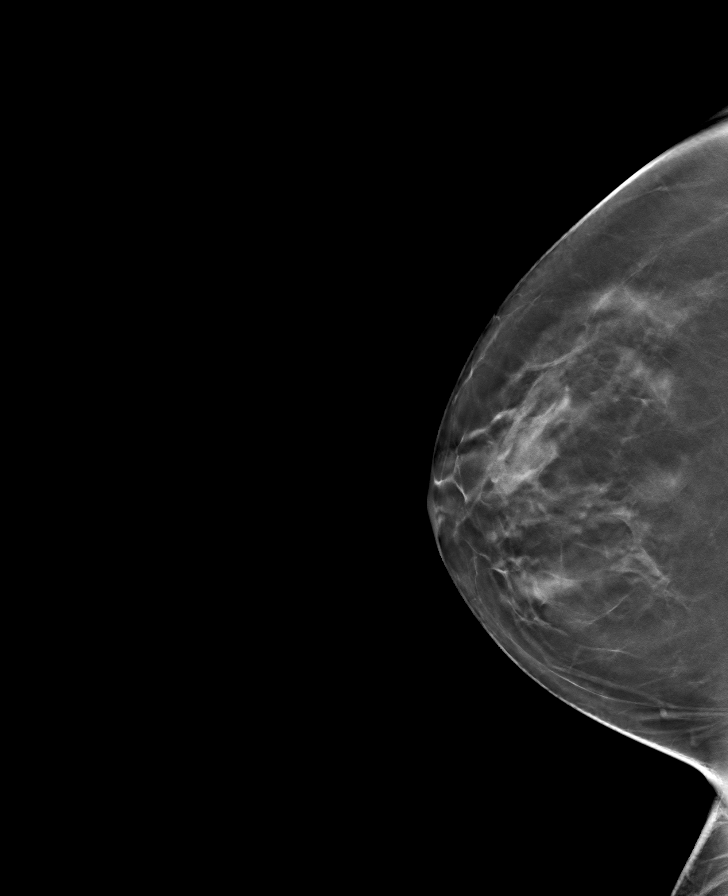

[R MLO tomo · tomo slice 39/76.0]
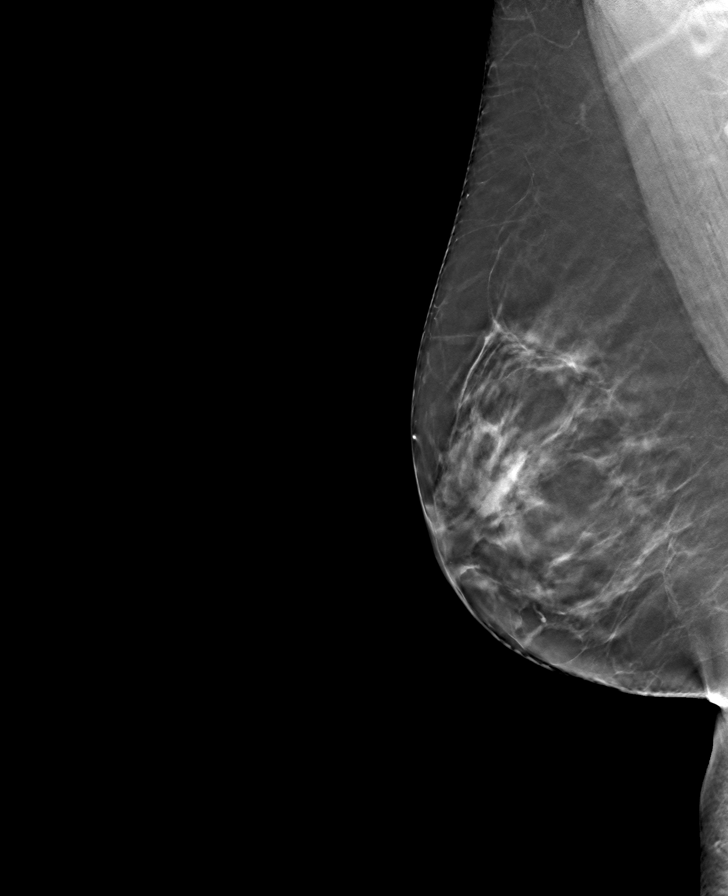

[L CC tomo · tomo slice 37/73.0]
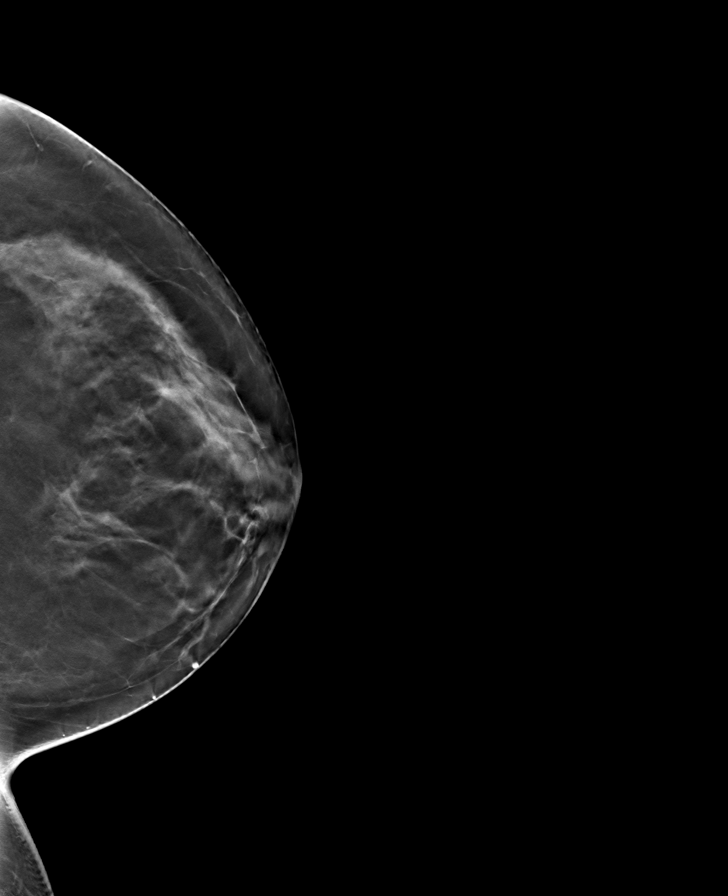

[8 of 24 positions shown; findings below may reference images not displayed]

ACR Breast Density Category b: There are scattered areas of
fibroglandular density.
FINDINGS: There are no findings suspicious for malignancy. Images were
processed with CAD.
IMPRESSION: No mammographic evidence of malignancy. A result letter of this
screening mammogram will be mailed directly to the patient.

RECOMMENDATION:
Screening mammogram in one year. (Code:[id number])

BI-RADS CATEGORY  1: Negative.

## 2022-04-04 ENCOUNTER — Other Ambulatory Visit: Payer: Self-pay | Admitting: Family Medicine

## 2022-04-04 DIAGNOSIS — Z1231 Encounter for screening mammogram for malignant neoplasm of breast: Secondary | ICD-10-CM

## 2022-05-06 ENCOUNTER — Encounter: Payer: Self-pay | Admitting: Emergency Medicine

## 2022-05-06 ENCOUNTER — Other Ambulatory Visit: Payer: Self-pay

## 2022-05-06 ENCOUNTER — Emergency Department
Admission: EM | Admit: 2022-05-06 | Discharge: 2022-05-06 | Disposition: A | Discharge status: Home / Self Care | Payer: BC Managed Care – PPO | Attending: Emergency Medicine | Admitting: Emergency Medicine

## 2022-05-06 DIAGNOSIS — R55 Syncope and collapse: Secondary | ICD-10-CM | POA: Diagnosis present

## 2022-05-06 LAB — URINALYSIS, ROUTINE W REFLEX MICROSCOPIC
Bilirubin Urine: NEGATIVE
Glucose, UA: NEGATIVE mg/dL
Hgb urine dipstick: NEGATIVE
Ketones, ur: NEGATIVE mg/dL
Leukocytes,Ua: NEGATIVE
Nitrite: NEGATIVE
Protein, ur: NEGATIVE mg/dL
Specific Gravity, Urine: 1.012 (ref 1.005–1.030)
pH: 8 (ref 5.0–8.0)

## 2022-05-06 LAB — CBC
HCT: 37.7 % (ref 36.0–46.0)
Hemoglobin: 12.3 g/dL (ref 12.0–15.0)
MCH: 28.5 pg (ref 26.0–34.0)
MCHC: 32.6 g/dL (ref 30.0–36.0)
MCV: 87.5 fL (ref 80.0–100.0)
Platelets: 246 10*3/uL (ref 150–400)
RBC: 4.31 MIL/uL (ref 3.87–5.11)
RDW: 12.5 % (ref 11.5–15.5)
WBC: 6.3 10*3/uL (ref 4.0–10.5)
nRBC: 0 % (ref 0.0–0.2)

## 2022-05-06 NOTE — ED Provider Notes (Signed)
[facility] Provider Note    Event Date/Time   First MD Initiated Contact with Patient 05/06/22 1234     (approximate)   History   Loss of Consciousness   HPI  Kara Edwards is a 51 y.o. female with a history of GERD who presents after syncopal episode status post COVID booster vaccine.  Patient reports she received vaccine, several minutes afterwards started to feel lightheaded, she laid down and apparently had a syncopal episode.  She denies chest pain, no palpitations.  No allergic symptoms otherwise.  Feels well currently and has no complaints.     Physical Exam   Triage Vital Signs: ED Triage Vitals  Enc Vitals Group     BP 05/06/22 1204 123/88     Pulse Rate 05/06/22 1204 (!) 57     Resp 05/06/22 1204 18     Temp 05/06/22 1204 98 F (36.7 C)     Temp Source 05/06/22 1204 Oral     SpO2 05/06/22 1204 98 %     Weight 05/06/22 1235 62.1 kg (136 lb 14.5 oz)     Height 05/06/22 1235 1.575 m ('5\' 2"'$ )     Head Circumference --      Peak Flow --      Pain Score 05/06/22 1204 0     Pain Loc --      Pain Edu? --      Excl. in [place]? --     Most recent vital signs: Vitals:   05/06/22 1204  BP: 123/88  Pulse: (!) 57  Resp: 18  Temp: 98 F (36.7 C)  SpO2: 98%     General: Awake, no distress.  CV:  Good peripheral perfusion.  Regular rate and rhythm, no murmur Resp:  Normal effort.  Abd:  No distention.  Other:     ED Results / Procedures / Treatments   Labs (all labs ordered are listed, but only abnormal results are displayed) Labs Reviewed  URINALYSIS, ROUTINE W REFLEX MICROSCOPIC - Abnormal; Notable for the following components:      Result Value   Color, Urine YELLOW (*)    APPearance HAZY (*)    All other components within normal limits  CBC  CBG MONITORING, ED  POC URINE PREG, ED     EKG  ED ECG REPORT I, Lavonia Drafts, the attending physician, personally viewed and interpreted this ECG.  Date: 05/06/2022  Rhythm:  normal sinus rhythm QRS Axis: normal Intervals: normal ST/T Wave abnormalities: normal Narrative Interpretation: no evidence of acute ischemia    RADIOLOGY     PROCEDURES:  Critical Care performed:   Procedures   MEDICATIONS ORDERED IN ED: Medications - No data to display   IMPRESSION / MDM / [place] / ED COURSE  I reviewed the triage vital signs and the nursing notes. Patient's presentation is most consistent with acute illness / injury with system symptoms.  Patient presents after single episode status post vaccination.  Differential includes vaccine reaction, vasovagal response, allergic reaction.  Brought well-appearing and asymptomatic at this time, lab work reviewed and is reassuring, EKG is unremarkable.  Appropriate discharge at this time, no further workup needed, no indication for admission.        FINAL CLINICAL IMPRESSION(S) / ED DIAGNOSES   Final diagnoses:  Syncope, unspecified syncope type     Rx / DC Orders   ED Discharge Orders     None        Note:  This document was prepared using Dragon voice recognition software and may include unintentional dictation errors.   Lavonia Drafts, MD 05/06/22 1326

## 2022-05-06 NOTE — ED Triage Notes (Signed)
Patient to ED via [facility] from [facility]. Patient was receiving a covid vaccine when she had a syncopal episode. Aox4 at ED. Denies pain.

## 2022-05-06 NOTE — ED Triage Notes (Signed)
First Nurse: Pt here via [facility] after receiving her covid shot. BP and HR dropped but pt was stable. Pt stable on arrival to ED.   20G RAC-500cc of NS given 114-cbg SB 56

## 2022-05-06 NOTE — ED Provider Triage Note (Signed)
Emergency Medicine Provider Triage Evaluation Note  Kara Edwards , a 51 y.o. female  was evaluated in triage.  Pt complains of syncopal episode after COVID-vaccine earlier..  Review of Systems  Positive:  Negative:   Physical Exam  LMP 11/24/2017 (Exact Date)  Gen:   Awake, no distress   Resp:  Normal effort  MSK:   Moves extremities without difficulty  Other:    Medical Decision Making  Medically screening exam initiated at 12:00 PM.  Appropriate orders placed.  Kara Edwards was informed that the remainder of the evaluation will be completed by another provider, this initial triage assessment does not replace that evaluation, and the importance of remaining in the ED until their evaluation is complete.     Versie Starks, PA-C 05/06/22 1201

## 2022-05-14 ENCOUNTER — Ambulatory Visit: Payer: 59

## 2022-06-19 ENCOUNTER — Ambulatory Visit
Admission: RE | Admit: 2022-06-19 | Discharge: 2022-06-19 | Disposition: A | Discharge status: Home / Self Care | Payer: BC Managed Care – PPO | Source: Ambulatory Visit | Attending: Family Medicine | Admitting: Family Medicine

## 2022-06-19 DIAGNOSIS — Z1231 Encounter for screening mammogram for malignant neoplasm of breast: Secondary | ICD-10-CM | POA: Diagnosis present

## 2022-07-25 ENCOUNTER — Emergency Department: Payer: BC Managed Care – PPO

## 2022-07-25 ENCOUNTER — Other Ambulatory Visit: Payer: Self-pay

## 2022-07-25 ENCOUNTER — Emergency Department
Admission: EM | Admit: 2022-07-25 | Discharge: 2022-07-25 | Disposition: A | Discharge status: Home / Self Care | Payer: BC Managed Care – PPO | Attending: Emergency Medicine | Admitting: Emergency Medicine

## 2022-07-25 DIAGNOSIS — R55 Syncope and collapse: Secondary | ICD-10-CM | POA: Diagnosis present

## 2022-07-25 DIAGNOSIS — M545 Low back pain, unspecified: Secondary | ICD-10-CM | POA: Insufficient documentation

## 2022-07-25 LAB — URINALYSIS, COMPLETE (UACMP) WITH MICROSCOPIC
Bacteria, UA: NONE SEEN
Bilirubin Urine: NEGATIVE
Glucose, UA: NEGATIVE mg/dL
Hgb urine dipstick: NEGATIVE
Ketones, ur: NEGATIVE mg/dL
Nitrite: NEGATIVE
Protein, ur: NEGATIVE mg/dL
Specific Gravity, Urine: 1.019 (ref 1.005–1.030)
pH: 6 (ref 5.0–8.0)

## 2022-07-25 LAB — COMPREHENSIVE METABOLIC PANEL
ALT: 14 U/L (ref 0–44)
AST: 21 U/L (ref 15–41)
Albumin: 4 g/dL (ref 3.5–5.0)
Alkaline Phosphatase: 89 U/L (ref 38–126)
Anion gap: 10 (ref 5–15)
BUN: 12 mg/dL (ref 6–20)
CO2: 26 mmol/L (ref 22–32)
Calcium: 8.9 mg/dL (ref 8.9–10.3)
Chloride: 102 mmol/L (ref 98–111)
Creatinine, Ser: 0.7 mg/dL (ref 0.44–1.00)
GFR, Estimated: >60 mL/min (ref >60)
Glucose, Bld: 106 mg/dL — ABNORMAL HIGH (ref 70–99)
Potassium: 3.3 mmol/L — ABNORMAL LOW (ref 3.5–5.1)
Sodium: 138 mmol/L (ref 135–145)
Total Bilirubin: 0.7 mg/dL (ref 0.3–1.2)
Total Protein: 7.2 g/dL (ref 6.5–8.1)

## 2022-07-25 LAB — CBC
HCT: 38.3 % (ref 36.0–46.0)
Hemoglobin: 12.6 g/dL (ref 12.0–15.0)
MCH: 28.5 pg (ref 26.0–34.0)
MCHC: 32.9 g/dL (ref 30.0–36.0)
MCV: 86.7 fL (ref 80.0–100.0)
Platelets: 228 10*3/uL (ref 150–400)
RBC: 4.42 MIL/uL (ref 3.87–5.11)
RDW: 12.3 % (ref 11.5–15.5)
WBC: 4.9 10*3/uL (ref 4.0–10.5)
nRBC: 0 % (ref 0.0–0.2)

## 2022-07-25 LAB — TROPONIN I (HIGH SENSITIVITY): Troponin I (High Sensitivity): <2 ng/L (ref <18)

## 2022-07-25 LAB — POC URINE PREG, ED: Preg Test, Ur: NEGATIVE

## 2022-07-25 MED ORDER — SODIUM CHLORIDE 0.9 % IV BOLUS
1000.0000 mL | Freq: Once | INTRAVENOUS | Status: AC
  Administered 2022-07-25: 1000 mL via INTRAVENOUS

## 2022-07-25 NOTE — ED Provider Notes (Signed)
[facility] Provider Note    Event Date/Time   First MD Initiated Contact with Patient 07/25/22 1117     (approximate)  History   Chief Complaint: Loss of Consciousness  HPI  Kara Edwards is a 52 y.o. female with a past medical history of gastric reflux who presents to the emergency department after syncopal event.  According to the patient she has been experiencing some lower back pain over the last few weeks, recently went to a chiropractor to have an adjustment.  Patient states today she was having some lower back pain she was placing an ice pack on the back.  Patient states she was having pain in the back she became lightheaded and had a syncopal event.  Patient states she hit the back of her head and was having head pain as well.  Patient states a history of syncope in the past, last episode occurred in December after a vaccine injection.  Patient denies any chest pain or shortness of breath.  Currently patient states she is feeling much better and appears well-appearing on my exam.  Patient states the chiropractor only worked on her lower back not on her neck.  Physical Exam   Triage Vital Signs: ED Triage Vitals  Enc Vitals Group     BP 07/25/22 1111 133/75     Pulse Rate 07/25/22 1111 (!) 58     Resp 07/25/22 1111 20     Temp 07/25/22 1111 97.9 F (36.6 C)     Temp src --      SpO2 07/25/22 1111 99 %     Weight 07/25/22 1110 151 lb 14.4 oz (68.9 kg)     Height 07/25/22 1110 '5\' 2"'$  (1.575 m)     Head Circumference --      Peak Flow --      Pain Score --      Pain Loc --      Pain Edu? --      Excl. in [place]? --     Most recent vital signs: Vitals:   07/25/22 1111  BP: 133/75  Pulse: (!) 58  Resp: 20  Temp: 97.9 F (36.6 C)  SpO2: 99%    General: Awake, no distress.  CV:  Good peripheral perfusion.  Regular rate and rhythm  Resp:  Normal effort.  Equal breath sounds bilaterally.  Abd:  No distention.  Soft, nontender.  No rebound or  guarding. Other:  No CT or L-spine tenderness to palpation.  No step-offs or deformities.   ED Results / Procedures / Treatments   EKG  EKG viewed and interpreted by myself shows a normal sinus rhythm at 60 bpm with a narrow QRS, normal axis, largely normal intervals with no concerning ST changes.  RADIOLOGY  I have reviewed and interpreted the CT head images.  No significant intracranial hemorrhage or other significant abnormality seen on my evaluation. Radiology is read the CT scan is negative   MEDICATIONS ORDERED IN ED: Medications - No data to display   IMPRESSION / MDM / [place] / ED COURSE  I reviewed the triage vital signs and the nursing notes.  Patient's presentation is most consistent with acute presentation with potential threat to life or bodily function.  Patient presents emergency department for a syncopal event.  Event occurred while the patient was experiencing what she describes as a spasm of pain to the lower back.  States her back discomfort is much improved.  Has a history of  syncopal events last of which occurred while getting an injection.  Patient could very likely be experiencing vagal events due to pain response.  However as a precaution we will check labs we will IV hydrate.  Patient states she hit her head but is having head pain we will obtain a CT scan head as a precaution.  Patient agreeable to plan of care.  Vital signs reassuring.  Physical exam reassuring.  Patient's labs are largely reassuring, CBC is normal, chemistry reassuring, troponin is negative, pregnancy test negative, urinalysis shows no concerning findings.  Patient CT scan of the head is resulted negative as well.  Highly suspect vagal event.  We will have the patient follow-up with her doctor.  Patient has received fluids and is feeling better.  FINAL CLINICAL IMPRESSION(S) / ED DIAGNOSES   Syncope  Note:  This document was prepared using Dragon voice recognition software and  may include unintentional dictation errors.   Harvest Dark, MD 07/25/22 1321

## 2022-07-25 NOTE — ED Triage Notes (Signed)
EMS reports patient had two syncopal episodes today at work, history of same related to dehydration

## 2022-07-25 NOTE — Discharge Instructions (Signed)
Your workup in the emergency department has shown reassuring/normal results.  Please drink plenty of fluids.  Return to the emergency department for any further episodes of passing out, feeling lightheaded/dizzy, or any other symptom personally concerning to yourself.

## 2023-09-02 ENCOUNTER — Other Ambulatory Visit: Payer: Self-pay | Admitting: Family Medicine

## 2023-09-02 DIAGNOSIS — Z1231 Encounter for screening mammogram for malignant neoplasm of breast: Secondary | ICD-10-CM

## 2023-10-22 ENCOUNTER — Ambulatory Visit
Admission: RE | Admit: 2023-10-22 | Discharge: 2023-10-22 | Disposition: A | Discharge status: Home / Self Care | Source: Ambulatory Visit | Attending: Family Medicine | Admitting: Family Medicine

## 2023-10-22 DIAGNOSIS — Z1231 Encounter for screening mammogram for malignant neoplasm of breast: Secondary | ICD-10-CM | POA: Diagnosis present
